# Patient Record
Sex: Female | Born: 1985 | Hispanic: No | Marital: Married | Smoking: Never smoker
Health system: Southern US, Community
[De-identification: ages and names within clinical notes are randomized; demographics above are authoritative.]

## PROBLEM LIST (undated history)

## (undated) DIAGNOSIS — Z789 Other specified health status: Secondary | ICD-10-CM

## (undated) HISTORY — PX: MASTECTOMY: SHX3

---

## 2017-09-03 LAB — OB RESULTS CONSOLE GC/CHLAMYDIA
Chlamydia: NEGATIVE
Gonorrhea: NEGATIVE

## 2017-09-03 LAB — OB RESULTS CONSOLE HIV ANTIBODY (ROUTINE TESTING): HIV: NONREACTIVE

## 2017-09-03 LAB — OB RESULTS CONSOLE HEPATITIS B SURFACE ANTIGEN: Hepatitis B Surface Ag: NEGATIVE

## 2017-09-03 LAB — OB RESULTS CONSOLE RUBELLA ANTIBODY, IGM: Rubella: IMMUNE

## 2017-12-17 ENCOUNTER — Inpatient Hospital Stay (HOSPITAL_COMMUNITY): Admission: AD | Admit: 2017-12-17 | Payer: Self-pay | Source: Ambulatory Visit | Admitting: Obstetrics & Gynecology

## 2017-12-18 NOTE — L&D Delivery Note (Addendum)
Obstetrical Delivery Note   Date of Delivery:   03/08/2018 [facility] OB[facility]   [facility] Gestational Age/EDD: 2620w2d  Reason for Admission: Active labor Antepartum complications: none  Delivered By:   Cornelia Copaharlie Arlen Legendre, Jr MD  Delivery Type:   vacuum, outlet  Delivery Details:   Called to assess patient for assistance with vaginal delivery. Patient pushed decently well to +4 but patient with poor maternal effort past this. I offered her operative vaginal delivery via vacuum assistance, which she agreed to. Foley removed and kiwi vacuum placed at flexion point and easily delivered with next contraction. Newborn not vigorous on delivery so immediately cord clamped and cut and handed to awaiting peds team. Placenta out via active third stage. Rectal done and 3b perineal laceration noted; rectal confirmatory. I supervised Dr. Nira Retortegele and allis clamps used to grasp both retracted ends and 1-0 interrupted vicryl sutures used to re approximate the ends in an end to end fashion, with good support and tone at the end of the procedure. Dr. Nira Retortegele to finish repair by herself, but I was called back into the room after a few minutes due to brisk bleeding. Atony suspected and ?membranes removed and clot. Placenta inspected and no fragments appeared to be missing and tone improved with bimanual massage, more pitocin and IM methergine x 1; also buccal cytotec given. Brisk bleeding still noted and cervix ran and large posterior cervical laceration noted, approx 5-6cm. With great difficulty, this was repaired by me using #2 chromic and 2-0 vicryl in running fashion and locking at times. Hemostasis noted from this area and I supervised Dr. Nira Retortegele finish the remainder of the repair using 2-0 and 1-0 vicryl in the usual fashion.  Rectal exam at the end was negative Anesthesia:    epidural Intrapartum complications: None GBS:    Negative Laceration:    3rd degree (3b) and posterior cervical  laceration Episiotomy:    none Rectal exam:   negative Placenta:    Delivered and expressed via active management. Intact: yes. To pathology: no.  Delayed Cord Clamping: no Estimated Blood Loss:  2000mL  Baby:    Liveborn female, APGARs 7/9, weight 3805gm  CBC obtained and results below. Will trend them q4h and foley catheter to be placed to closely follow UOP. 1L IVF bolus given during procedure.   CBC Latest Ref Rng & Units 03/08/2018 03/08/2018  WBC 4.0 - 10.5 K/uL 24.7(H) 12.2(H)  Hemoglobin 12.0 - 15.0 g/dL 1.6(X9.7(L) 09.614.0  Hematocrit 36.0 - 46.0 % 29.0(L) 40.9  Platelets 150 - 400 K/uL 134(L) 162     Cornelia Copaharlie Aristides Luckey, Jr. MD Attending Center for [facility](Faculty Practice)

## 2018-01-31 LAB — OB RESULTS CONSOLE GBS: GBS: NEGATIVE

## 2018-02-08 ENCOUNTER — Other Ambulatory Visit: Payer: Self-pay | Admitting: Obstetrics & Gynecology

## 2018-02-08 DIAGNOSIS — O4440 Low lying placenta NOS or without hemorrhage, unspecified trimester: Secondary | ICD-10-CM

## 2018-02-12 ENCOUNTER — Encounter (HOSPITAL_COMMUNITY): Payer: Self-pay | Admitting: Radiology

## 2018-02-12 ENCOUNTER — Ambulatory Visit (HOSPITAL_COMMUNITY)
Admission: RE | Admit: 2018-02-12 | Discharge: 2018-02-12 | Disposition: A | Discharge status: Home / Self Care | Payer: Medicaid Other | Source: Ambulatory Visit | Attending: Obstetrics & Gynecology | Admitting: Obstetrics & Gynecology

## 2018-02-12 ENCOUNTER — Other Ambulatory Visit: Payer: Self-pay | Admitting: Obstetrics & Gynecology

## 2018-02-12 DIAGNOSIS — O4440 Low lying placenta NOS or without hemorrhage, unspecified trimester: Secondary | ICD-10-CM

## 2018-02-12 DIAGNOSIS — O4442 Low lying placenta NOS or without hemorrhage, second trimester: Secondary | ICD-10-CM | POA: Diagnosis not present

## 2018-02-12 DIAGNOSIS — Z3A26 26 weeks gestation of pregnancy: Secondary | ICD-10-CM | POA: Diagnosis not present

## 2018-02-12 DIAGNOSIS — Z3A36 36 weeks gestation of pregnancy: Secondary | ICD-10-CM

## 2018-02-12 IMAGING — US US MFM OB TRANSVAGINAL
1 series · 15 of 28 positions shown · non-contrast
Comparison: none

[Series 1: us mfm ob transvaginal · 33 acquisitions, 15 frames shown]
[im 1/33]
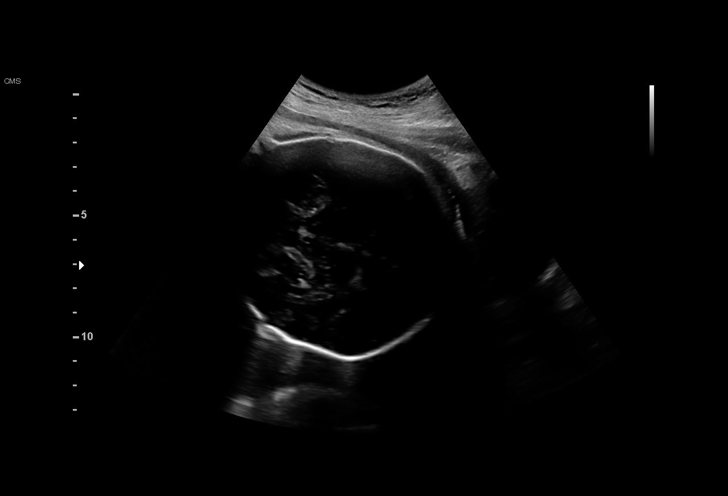
[im 3/33]
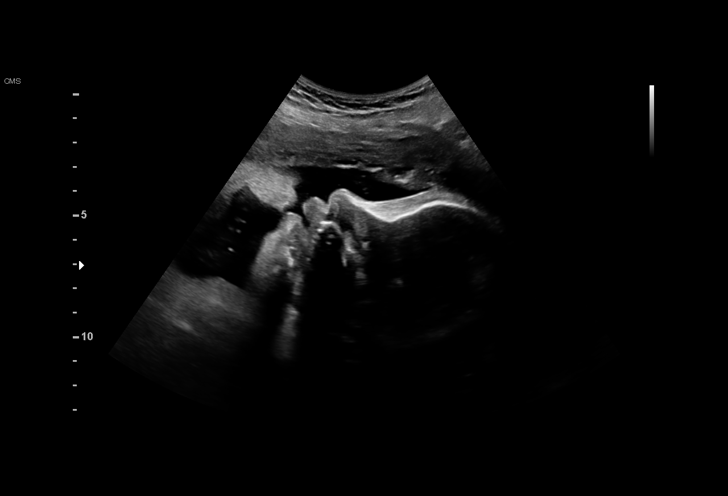
[im 5/33]
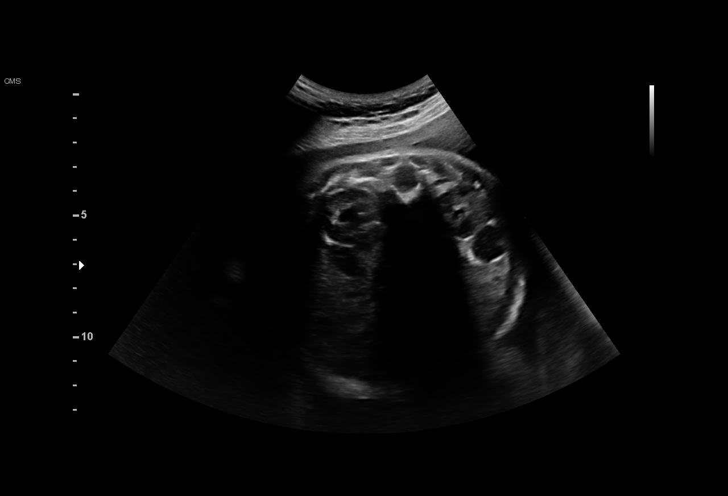
[im 8/33]
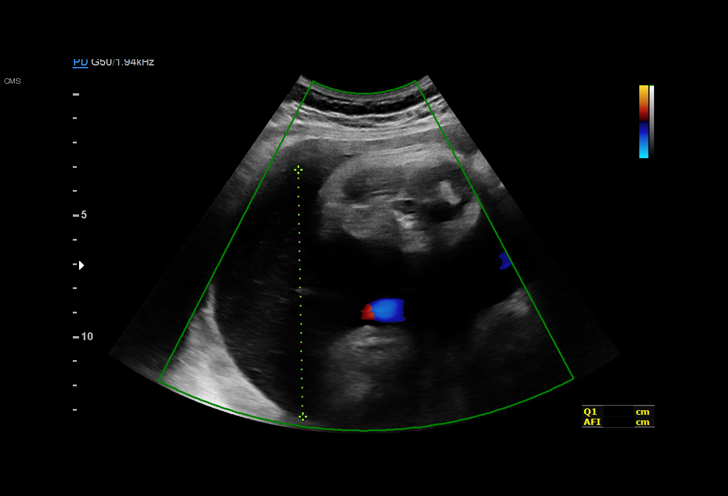
[im 10/33]
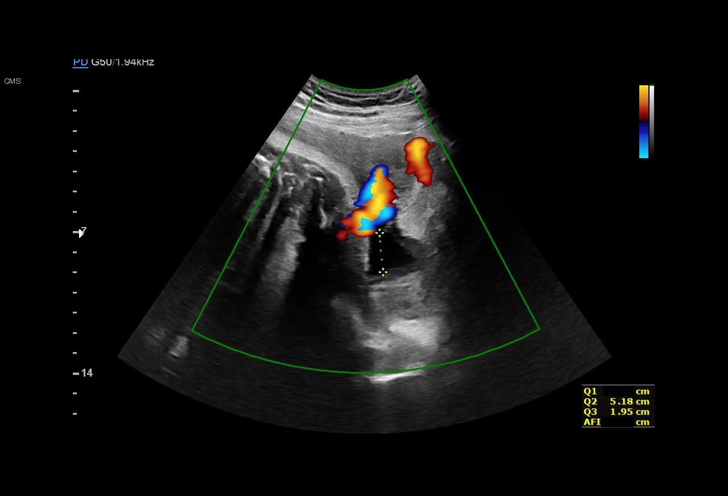
[im 12/33]
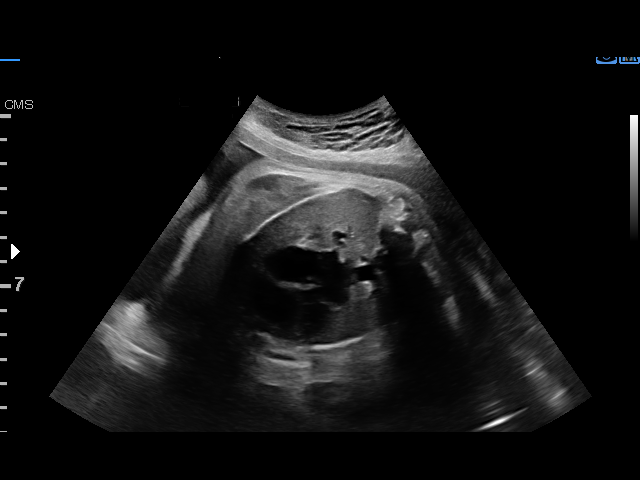
[im 15/33]
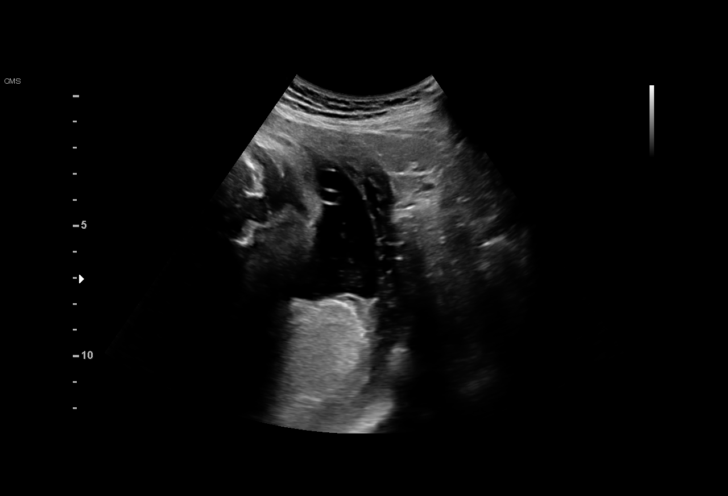
[im 17/33]
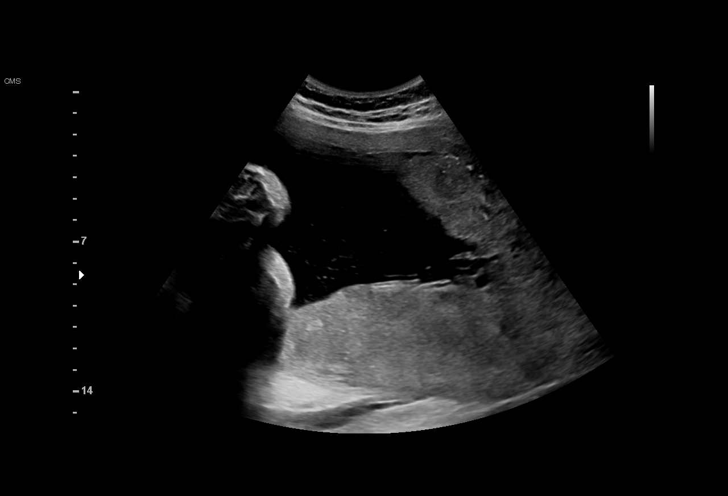
[im 18/33]
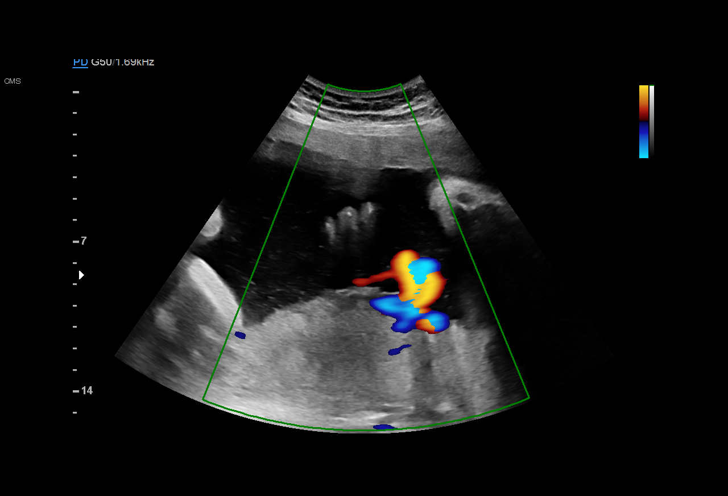
[im 21/33]
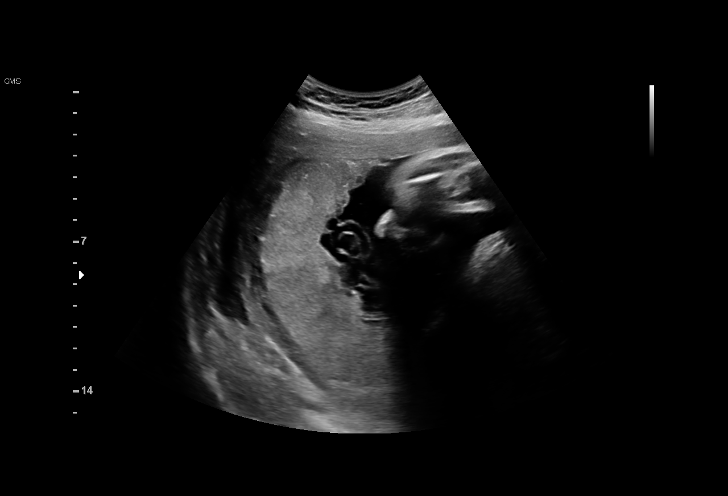
[im 23/33]
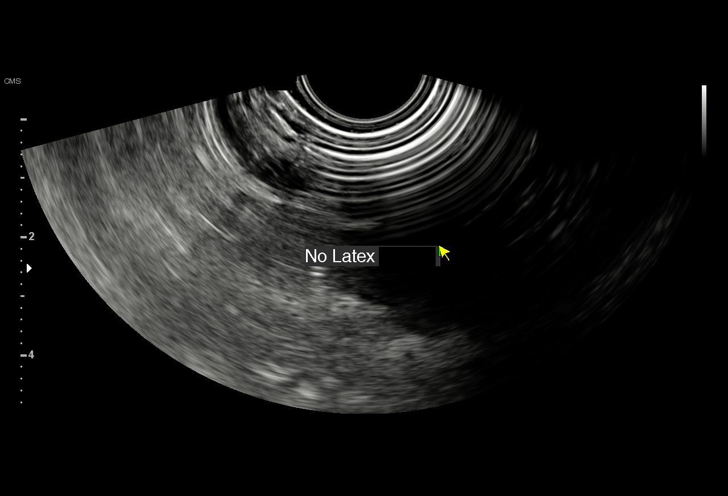
[im 25/33]
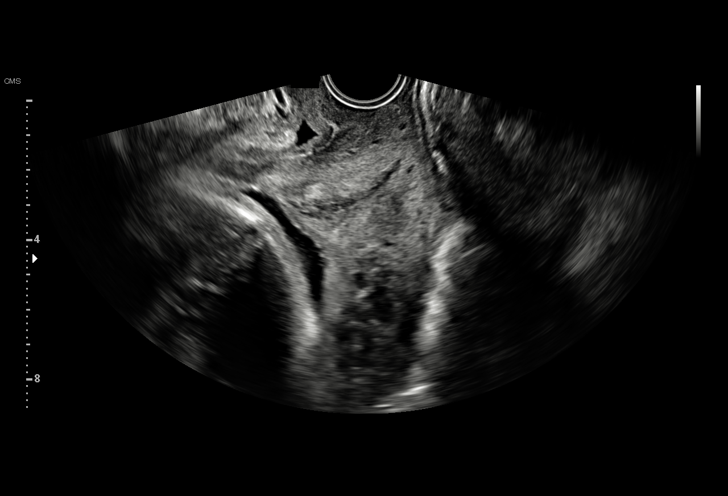
[im 28/33]
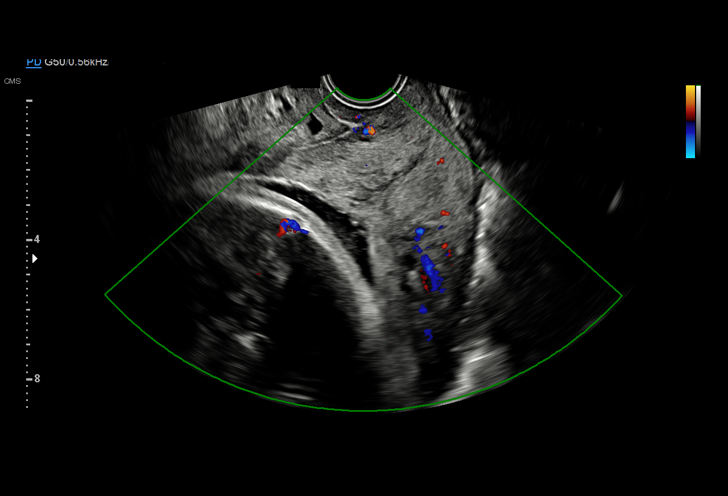
[im 30/33]
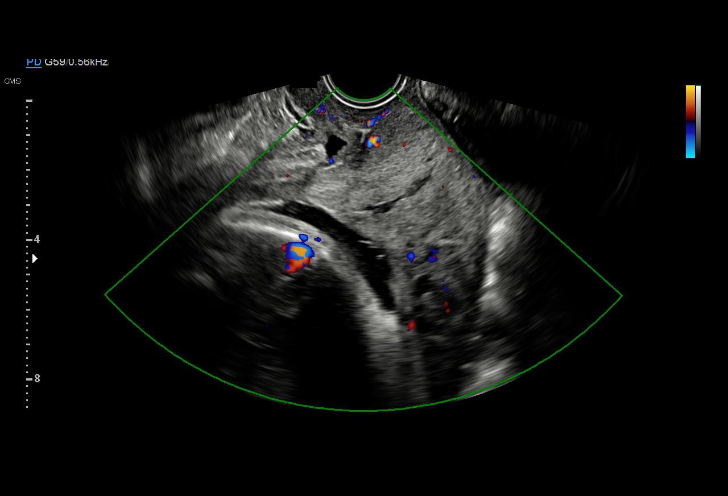
[im 33/33]
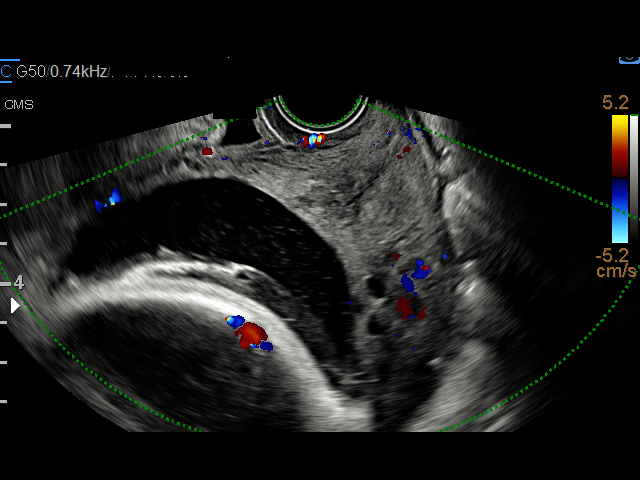

[15 of 28 positions shown; findings below may reference images not displayed]

[REDACTED]-
Faculty Physician

1  IDALISE             [PHONE_NUMBER]      [PHONE_NUMBER]     [PHONE_NUMBER]
2  IDALISE             [PHONE_NUMBER]      [PHONE_NUMBER]     [PHONE_NUMBER]
Indications

36 weeks gestation of pregnancy
Low lying placenta, antepartum                 [H2]
OB History

Gravidity:    1         Term:   0        Prem:   0        SAB:   0
TOP:          0       Ectopic:  0        Living: 0
Fetal Evaluation

Num Of Fetuses:     1
Fetal Heart         144
Rate(bpm):
Cardiac Activity:   Observed
Presentation:       Cephalic
Placenta:           Right lateral, above cervical os
P. Cord Insertion:  Visualized

Amniotic Fluid
AFI FV:      Subjectively within normal limits

AFI Sum(cm)     %Tile       Largest Pocket(cm)
21.[REDACTED]
RUQ(cm)       RLQ(cm)       LUQ(cm)        LLQ(cm)
10.16
Gestational Age

LMP:           36w 6d        Date:  [DATE]                 EDD:   [DATE]
Best:          36w 6d     Det. By:  LMP  ([DATE])          EDD:   [DATE]
Cervix Uterus Adnexa

Cervix
Length:            3.1  cm.
Measured transvaginally.

Uterus
No abnormality visualized.

Left Ovary
Not visualized.

Right Ovary
Not visualized.

Cul De Sac:   No free fluid seen.

Adnexa:       No abnormality visualized.
Impression

SIUP at 36+6 weeks
Cephalic presentation
Normal amniotic fluid volume
EV views of cervix: normal length without funneling; no previa
Right lateral placenta
Recommendations

Follow-up as clinically indicated

## 2018-02-22 ENCOUNTER — Ambulatory Visit (HOSPITAL_COMMUNITY): Payer: Self-pay

## 2018-03-07 ENCOUNTER — Inpatient Hospital Stay (EMERGENCY_DEPARTMENT_HOSPITAL)
Admission: AD | Admit: 2018-03-07 | Discharge: 2018-03-07 | Disposition: A | Discharge status: Home / Self Care | Payer: Medicaid Other | Source: Ambulatory Visit | Attending: Obstetrics and Gynecology | Admitting: Obstetrics and Gynecology

## 2018-03-07 ENCOUNTER — Encounter (HOSPITAL_COMMUNITY): Payer: Self-pay | Admitting: *Deleted

## 2018-03-07 DIAGNOSIS — O471 False labor at or after 37 completed weeks of gestation: Secondary | ICD-10-CM | POA: Diagnosis not present

## 2018-03-07 DIAGNOSIS — Z0371 Encounter for suspected problem with amniotic cavity and membrane ruled out: Secondary | ICD-10-CM

## 2018-03-07 DIAGNOSIS — Z3A40 40 weeks gestation of pregnancy: Secondary | ICD-10-CM

## 2018-03-07 DIAGNOSIS — O479 False labor, unspecified: Secondary | ICD-10-CM

## 2018-03-07 HISTORY — DX: Other specified health status: Z78.9

## 2018-03-07 NOTE — Discharge Instructions (Signed)
Labor Induction Labor induction is when steps are taken to cause a pregnant woman to begin the labor process. Most women go into labor on their own between 37 weeks and 42 weeks of the pregnancy. When this does not happen or when there is a medical need, methods may be used to induce labor. Labor induction causes a pregnant woman's uterus to contract. It also causes the cervix to soften (ripen), open (dilate), and thin out (efface). Usually, labor is not induced before 39 weeks of the pregnancy unless there is a problem with the baby or mother. Before inducing labor, your health care provider will consider a number of factors, including the following:  The medical condition of you and the baby.  How many weeks along you are.  The status of the baby's lung maturity.  The condition of the cervix.  The position of the baby. What are the reasons for labor induction? Labor may be induced for the following reasons:  The health of the baby or mother is at risk.  The pregnancy is overdue by 1 week or more.  The water breaks but labor does not start on its own.  The mother has a health condition or serious illness, such as high blood pressure, infection, placental abruption, or diabetes.  The amniotic fluid amounts are low around the baby.  The baby is distressed. Convenience or wanting the baby to be born on a certain date is not a reason for inducing labor. What methods are used for labor induction? Several methods of labor induction may be used, such as:  Prostaglandin medicine. This medicine causes the cervix to dilate and ripen. The medicine will also start contractions. It can be taken by mouth or by inserting a suppository into the vagina.  Inserting a thin tube (catheter) with a balloon on the end into the vagina to dilate the cervix. Once inserted, the balloon is expanded with water, which causes the cervix to open.  Stripping the membranes. Your health care provider separates  amniotic sac tissue from the cervix, causing the cervix to be stretched and causing the release of a hormone called progesterone. This may cause the uterus to contract. It is often done during an office visit. You will be sent home to wait for the contractions to begin. You will then come in for an induction.  Breaking the water. Your health care provider makes a hole in the amniotic sac using a small instrument. Once the amniotic sac breaks, contractions should begin. This may still take hours to see an effect.  Medicine to trigger or strengthen contractions. This medicine is given through an IV access tube inserted into a vein in your arm. All of the methods of induction, besides stripping the membranes, will be done in the hospital. Induction is done in the hospital so that you and the baby can be carefully monitored. How long does it take for labor to be induced? Some inductions can take up to 2-3 days. Depending on the cervix, it usually takes less time. It takes longer when you are induced early in the pregnancy or if this is your first pregnancy. If a mother is still pregnant and the induction has been going on for 2-3 days, either the mother will be sent home or a cesarean delivery will be needed. What are the risks associated with labor induction? Some of the risks of induction include:  Changes in fetal heart rate, such as too high, too low, or erratic.  Fetal distress.    Chance of infection for the mother and baby.  Increased chance of having a cesarean delivery.  Breaking off (abruption) of the placenta from the uterus (rare).  Uterine rupture (very rare). When induction is needed for medical reasons, the benefits of induction may outweigh the risks. What are some reasons for not inducing labor? Labor induction should not be done if:  It is shown that your baby does not tolerate labor.  You have had previous surgeries on your uterus, such as a myomectomy or the removal of  fibroids.  Your placenta lies very low in the uterus and blocks the opening of the cervix (placenta previa).  Your baby is not in a head-down position.  The umbilical cord drops down into the birth canal in front of the baby. This could cut off the baby's blood and oxygen supply.  You have had a previous cesarean delivery.  There are unusual circumstances, such as the baby being extremely premature. This information is not intended to replace advice given to you by your health care provider. Make sure you discuss any questions you have with your health care provider. Document Released: 04/25/2007 Document Revised: 05/11/2016 Document Reviewed: 07/03/2013 Elsevier Interactive Patient Education  2017 Elsevier Inc.  

## 2018-03-07 NOTE — MAU Provider Note (Signed)
S: Ms. Rico AlaSui Cotrell is a 32 y.o. G1P0 at 5932w1d  who presents to MAU today complaining of leaking of fluid since 0900. She endorses vaginal bleeding. She endorses contractions. She reports normal fetal movement.    O: BP 117/75 (BP Location: Right Arm)   Pulse 68   Temp 97.9 F (36.6 C) (Oral)   Resp 16   Wt 167 lb (75.8 kg)   LMP 05/30/2017 (Exact Date)   SpO2 98%  GENERAL: Well-developed, well-nourished female in no acute distress.  HEAD: Normocephalic, atraumatic.  CHEST: Normal effort of breathing, regular heart rate ABDOMEN: Soft, nontender, gravid PELVIC: Normal external female genitalia. Vagina is pink and rugated. Cervix with normal contour, no lesions. Normal discharge.  Negative pooling. Small amount of dark red mucous and blood at cervical os.   Cervical exam:  Dilation: 1 Effacement (%): Thick Station: Ballotable Exam by:: Ma Hillock. Neill CNM  Fetal Monitoring: Baseline: 120 Variability: moderate Accelerations: 15x15 Decelerations: none Contractions: irregular uc's  Fern negative.   A: SIUP at 5232w1d  Membranes intact  P: Discharge home Postdates IOL scheduled for 3/27 at 0700.   Rolm Bookbindereill, Caroline M, [place] 03/07/2018 2:46 PM

## 2018-03-07 NOTE — MAU Note (Signed)
Contractions started this morning. Little bit of bleeding.  No water leaking.  No recent exam

## 2018-03-08 ENCOUNTER — Inpatient Hospital Stay (HOSPITAL_COMMUNITY): Payer: Medicaid Other | Admitting: Anesthesiology

## 2018-03-08 ENCOUNTER — Encounter (HOSPITAL_COMMUNITY): Payer: Self-pay

## 2018-03-08 ENCOUNTER — Other Ambulatory Visit: Payer: Self-pay

## 2018-03-08 ENCOUNTER — Inpatient Hospital Stay (HOSPITAL_COMMUNITY)
Admission: AD | Admit: 2018-03-08 | Discharge: 2018-03-10 | DRG: 768 | Disposition: A | Discharge status: Home / Self Care | Payer: Medicaid Other | Source: Ambulatory Visit | Attending: Obstetrics and Gynecology | Admitting: Obstetrics and Gynecology

## 2018-03-08 DIAGNOSIS — O9081 Anemia of the puerperium: Secondary | ICD-10-CM | POA: Diagnosis not present

## 2018-03-08 DIAGNOSIS — Z880 Allergy status to penicillin: Secondary | ICD-10-CM | POA: Diagnosis not present

## 2018-03-08 DIAGNOSIS — Z3A40 40 weeks gestation of pregnancy: Secondary | ICD-10-CM

## 2018-03-08 DIAGNOSIS — O471 False labor at or after 37 completed weeks of gestation: Secondary | ICD-10-CM | POA: Diagnosis not present

## 2018-03-08 DIAGNOSIS — Z8759 Personal history of other complications of pregnancy, childbirth and the puerperium: Secondary | ICD-10-CM

## 2018-03-08 DIAGNOSIS — Z3483 Encounter for supervision of other normal pregnancy, third trimester: Secondary | ICD-10-CM | POA: Diagnosis present

## 2018-03-08 DIAGNOSIS — D62 Acute posthemorrhagic anemia: Secondary | ICD-10-CM

## 2018-03-08 LAB — CBC
HEMATOCRIT: 40.9 % (ref 36.0–46.0)
HEMATOCRIT: 29 % — AB (ref 36.0–46.0)
HEMOGLOBIN: 14 g/dL (ref 12.0–15.0)
Hemoglobin: 9.7 g/dL — ABNORMAL LOW (ref 12.0–15.0)
MCH: 30.4 pg (ref 26.0–34.0)
MCH: 29.8 pg (ref 26.0–34.0)
MCHC: 34.2 g/dL (ref 30.0–36.0)
MCHC: 33.4 g/dL (ref 30.0–36.0)
MCV: 88.7 fL (ref 78.0–100.0)
MCV: 89 fL (ref 78.0–100.0)
Platelets: 162 10*3/uL (ref 150–400)
Platelets: 134 10*3/uL — ABNORMAL LOW (ref 150–400)
RBC: 4.61 MIL/uL (ref 3.87–5.11)
RBC: 3.26 MIL/uL — ABNORMAL LOW (ref 3.87–5.11)
RDW: 13.6 % (ref 11.5–15.5)
RDW: 13.6 % (ref 11.5–15.5)
WBC: 12.2 10*3/uL — AB (ref 4.0–10.5)
WBC: 24.7 10*3/uL — AB (ref 4.0–10.5)

## 2018-03-08 LAB — ABO/RH: ABO/RH(D): B POS

## 2018-03-08 MED ORDER — PHENYLEPHRINE 40 MCG/ML (10ML) SYRINGE FOR IV PUSH (FOR BLOOD PRESSURE SUPPORT)
80.0000 ug | PREFILLED_SYRINGE | INTRAVENOUS | Status: DC | PRN
  Filled 2018-03-08: qty 5

## 2018-03-08 MED ORDER — FENTANYL 2.5 MCG/ML BUPIVACAINE 1/10 % EPIDURAL INFUSION (WH - ANES)
INTRAMUSCULAR | Status: AC
  Filled 2018-03-08: qty 100

## 2018-03-08 MED ORDER — MISOPROSTOL 200 MCG PO TABS
400.0000 ug | ORAL_TABLET | Freq: Once | ORAL | Status: AC
  Administered 2018-03-08: 400 ug via BUCCAL

## 2018-03-08 MED ORDER — OXYCODONE-ACETAMINOPHEN 5-325 MG PO TABS: 1.0000 | ORAL_TABLET | ORAL | Status: DC | PRN

## 2018-03-08 MED ORDER — ACETAMINOPHEN 325 MG PO TABS: 650.0000 mg | ORAL_TABLET | ORAL | Status: DC | PRN

## 2018-03-08 MED ORDER — OXYCODONE-ACETAMINOPHEN 5-325 MG PO TABS: 2.0000 | ORAL_TABLET | ORAL | Status: DC | PRN

## 2018-03-08 MED ORDER — DIPHENHYDRAMINE HCL 50 MG/ML IJ SOLN
INTRAMUSCULAR | Status: AC
  Filled 2018-03-08: qty 1

## 2018-03-08 MED ORDER — DIPHENHYDRAMINE HCL 50 MG/ML IJ SOLN: 12.5000 mg | INTRAMUSCULAR | Status: DC | PRN

## 2018-03-08 MED ORDER — LIDOCAINE HCL (PF) 1 % IJ SOLN
30.0000 mL | INTRAMUSCULAR | Status: AC | PRN
  Administered 2018-03-08 (×2): 30 mL via SUBCUTANEOUS
  Filled 2018-03-08 (×3): qty 30

## 2018-03-08 MED ORDER — LACTATED RINGERS IV SOLN: 500.0000 mL | Freq: Once | INTRAVENOUS | Status: DC

## 2018-03-08 MED ORDER — OXYTOCIN BOLUS FROM INFUSION
500.0000 mL | Freq: Once | INTRAVENOUS | Status: AC
  Administered 2018-03-08: 999 mL/h via INTRAVENOUS

## 2018-03-08 MED ORDER — LACTATED RINGERS IV BOLUS (SEPSIS): 500.0000 mL | Freq: Once | INTRAVENOUS | Status: DC

## 2018-03-08 MED ORDER — HYDROMORPHONE HCL 1 MG/ML IJ SOLN
1.0000 mg | Freq: Once | INTRAMUSCULAR | Status: DC
  Filled 2018-03-08: qty 1

## 2018-03-08 MED ORDER — LACTATED RINGERS IV SOLN
500.0000 mL | Freq: Once | INTRAVENOUS | Status: AC
  Administered 2018-03-08: 500 mL via INTRAVENOUS

## 2018-03-08 MED ORDER — PHENYLEPHRINE 40 MCG/ML (10ML) SYRINGE FOR IV PUSH (FOR BLOOD PRESSURE SUPPORT): 80.0000 ug | PREFILLED_SYRINGE | INTRAVENOUS | Status: DC | PRN

## 2018-03-08 MED ORDER — FLEET ENEMA 7-19 GM/118ML RE ENEM: 1.0000 | ENEMA | RECTAL | Status: DC | PRN

## 2018-03-08 MED ORDER — METHYLERGONOVINE MALEATE 0.2 MG/ML IJ SOLN
INTRAMUSCULAR | Status: AC
  Filled 2018-03-08: qty 1

## 2018-03-08 MED ORDER — LIDOCAINE HCL (PF) 1 % IJ SOLN
INTRAMUSCULAR | Status: DC | PRN
  Administered 2018-03-08: 6 mL via EPIDURAL
  Administered 2018-03-08: 30 mL

## 2018-03-08 MED ORDER — TERBUTALINE SULFATE 1 MG/ML IJ SOLN
INTRAMUSCULAR | Status: AC
  Administered 2018-03-08: 0.25 mg
  Filled 2018-03-08: qty 1

## 2018-03-08 MED ORDER — FENTANYL 2.5 MCG/ML BUPIVACAINE 1/10 % EPIDURAL INFUSION (WH - ANES)
14.0000 mL/h | INTRAMUSCULAR | Status: DC | PRN
  Administered 2018-03-08 (×2): 14 mL/h via EPIDURAL

## 2018-03-08 MED ORDER — PHENYLEPHRINE 40 MCG/ML (10ML) SYRINGE FOR IV PUSH (FOR BLOOD PRESSURE SUPPORT)
80.0000 ug | PREFILLED_SYRINGE | INTRAVENOUS | Status: DC | PRN
  Administered 2018-03-08 (×2): 40 ug via INTRAVENOUS
  Filled 2018-03-08: qty 10
  Filled 2018-03-08: qty 5

## 2018-03-08 MED ORDER — OXYTOCIN 40 UNITS IN LACTATED RINGERS INFUSION - SIMPLE MED
1.0000 m[IU]/min | INTRAVENOUS | Status: DC
  Administered 2018-03-08: 2 m[IU]/min via INTRAVENOUS

## 2018-03-08 MED ORDER — ONDANSETRON HCL 4 MG/2ML IJ SOLN: 4.0000 mg | Freq: Four times a day (QID) | INTRAMUSCULAR | Status: DC | PRN

## 2018-03-08 MED ORDER — FENTANYL CITRATE (PF) 100 MCG/2ML IJ SOLN
100.0000 ug | INTRAMUSCULAR | Status: DC | PRN
  Administered 2018-03-08: 100 ug via INTRAVENOUS

## 2018-03-08 MED ORDER — EPHEDRINE 5 MG/ML INJ
10.0000 mg | INTRAVENOUS | Status: DC | PRN
  Filled 2018-03-08: qty 2

## 2018-03-08 MED ORDER — TERBUTALINE SULFATE 1 MG/ML IJ SOLN
0.2500 mg | Freq: Once | INTRAMUSCULAR | Status: DC | PRN
  Filled 2018-03-08: qty 1

## 2018-03-08 MED ORDER — PHENYLEPHRINE 40 MCG/ML (10ML) SYRINGE FOR IV PUSH (FOR BLOOD PRESSURE SUPPORT)
PREFILLED_SYRINGE | INTRAVENOUS | Status: AC
  Filled 2018-03-08: qty 20

## 2018-03-08 MED ORDER — METHYLERGONOVINE MALEATE 0.2 MG/ML IJ SOLN
0.2000 mg | Freq: Once | INTRAMUSCULAR | Status: AC
  Administered 2018-03-08: 0.2 mg via INTRAMUSCULAR

## 2018-03-08 MED ORDER — SOD CITRATE-CITRIC ACID 500-334 MG/5ML PO SOLN: 30.0000 mL | ORAL | Status: DC | PRN

## 2018-03-08 MED ORDER — LACTATED RINGERS IV SOLN
INTRAVENOUS | Status: DC
  Administered 2018-03-08 (×3): via INTRAVENOUS

## 2018-03-08 MED ORDER — LACTATED RINGERS IV SOLN
500.0000 mL | INTRAVENOUS | Status: DC | PRN
  Administered 2018-03-08: 500 mL via INTRAVENOUS
  Administered 2018-03-08: 1000 mL via INTRAVENOUS
  Administered 2018-03-08: 500 mL via INTRAVENOUS

## 2018-03-08 MED ORDER — EPHEDRINE 5 MG/ML INJ: 10.0000 mg | INTRAVENOUS | Status: DC | PRN

## 2018-03-08 MED ORDER — DIPHENHYDRAMINE HCL 50 MG/ML IJ SOLN
50.0000 mg | Freq: Once | INTRAMUSCULAR | Status: AC
  Administered 2018-03-08: 50 mg via INTRAVENOUS

## 2018-03-08 MED ORDER — MISOPROSTOL 200 MCG PO TABS
600.0000 ug | ORAL_TABLET | Freq: Once | ORAL | Status: AC
  Administered 2018-03-08: 600 ug via RECTAL

## 2018-03-08 MED ORDER — OXYTOCIN 40 UNITS IN LACTATED RINGERS INFUSION - SIMPLE MED
2.5000 [IU]/h | INTRAVENOUS | Status: DC
  Filled 2018-03-08: qty 1000

## 2018-03-08 MED ORDER — FENTANYL CITRATE (PF) 100 MCG/2ML IJ SOLN
INTRAMUSCULAR | Status: AC
  Filled 2018-03-08: qty 2

## 2018-03-08 MED ORDER — MISOPROSTOL 200 MCG PO TABS
ORAL_TABLET | ORAL | Status: AC
  Filled 2018-03-08: qty 5

## 2018-03-08 NOTE — Progress Notes (Signed)
Interpretor used to answer triage questions and explain plan of care

## 2018-03-08 NOTE — MAU Note (Signed)
Urine in the lab  

## 2018-03-08 NOTE — Anesthesia Preprocedure Evaluation (Signed)
Anesthesia Evaluation  Patient identified by MRN, date of birth, ID band Patient awake    Reviewed: Allergy & Precautions, NPO status , Patient's Chart, lab work & pertinent test results  Airway Mallampati: II  TM Distance: >3 FB Neck ROM: Full    Dental no notable dental hx.    Pulmonary neg pulmonary ROS,    Pulmonary exam normal breath sounds clear to auscultation       Cardiovascular negative cardio ROS Normal cardiovascular exam Rhythm:Regular Rate:Normal     Neuro/Psych negative neurological ROS     GI/Hepatic negative GI ROS,   Endo/Other    Renal/GU      Musculoskeletal   Abdominal   Peds  Hematology   Anesthesia Other Findings   Reproductive/Obstetrics (+) Pregnancy                             Lab Results  Component Value Date   WBC 12.2 (H) 03/08/2018   HGB 14.0 03/08/2018   HCT 40.9 03/08/2018   MCV 88.7 03/08/2018   PLT 162 03/08/2018    Anesthesia Physical Anesthesia Plan  ASA: II  Anesthesia Plan: Epidural   Post-op Pain Management:    Induction:   PONV Risk Score and Plan:   Airway Management Planned:   Additional Equipment:   Intra-op Plan:   Post-operative Plan:   Informed Consent: I have reviewed the patients History and Physical, chart, labs and discussed the procedure including the risks, benefits and alternatives for the proposed anesthesia with the patient or authorized representative who has indicated his/her understanding and acceptance.     Plan Discussed with:   Anesthesia Plan Comments:         Anesthesia Quick Evaluation

## 2018-03-08 NOTE — MAU Note (Signed)
Pt reports to MAU for labor eval.Pt was in MAU yesterday for labor eval and was sent home. Pt states contractions all night long and reports pain a 7.

## 2018-03-08 NOTE — Anesthesia Procedure Notes (Signed)
Epidural Patient location during procedure: OB Start time: 03/08/2018 5:08 PM End time: 03/08/2018 5:31 PM  Staffing Anesthesiologist: Trevor IhaHouser, Gerard Cantara A, MD Performed: anesthesiologist   Preanesthetic Checklist Completed: patient identified, site marked, surgical consent, pre-op evaluation, timeout performed, IV checked, risks and benefits discussed and monitors and equipment checked  Epidural Patient position: sitting Prep: site prepped and draped and DuraPrep Patient monitoring: continuous pulse ox and blood pressure Approach: midline Location: L2-L3 Injection technique: LOR air  Needle:  Needle type: Tuohy  Needle gauge: 17 G Needle length: 9 cm and 9 Needle insertion depth: 5 cm cm Catheter type: closed end flexible Catheter size: 19 Gauge Catheter at skin depth: 10 cm Test dose: negative  Assessment Events: blood not aspirated, injection not painful, no injection resistance, negative IV test and no paresthesia

## 2018-03-08 NOTE — Progress Notes (Signed)
CNM called to bedside to evaluate patient. Patient received IV fentanyl and broke out in a rash on both arms. Denies any shortness of breath or other symptoms. 50mg  benedryl IV given. Rash continues to redden. Will monitor patient closely.  Rolm BookbinderCaroline M Neill, CNM 03/08/18 1:24 PM

## 2018-03-08 NOTE — H&P (Addendum)
OBSTETRIC ADMISSION HISTORY AND PHYSICAL  Colleen [name] is a 32 y.o. female G1P0 with IUP at [redacted]w[redacted]d by LMP presenting with labor. She reports she has been having contractions (7-10/10 in severity) every 3-5 minutes. She reports small amount of vaginal bleeding, +FMs, No LOF, no blurry vision, headaches or peripheral edema, and RUQ pain.  She plans on breast feeding. She declines birth control  She received her prenatal care at [facility]   iPad Interpreter [phone] used to obtain history, patient and husband speak Burmese.   Dating: By LMP --->  Estimated Date of Delivery: 03/06/18  Sono:    @[redacted]w[redacted]d , normal anatomy, cephalic presentation, 347g, 80% EFW, AFI normal, posterior low lying placenta  @[redacted]w[redacted]d , cephalic presentation, AFI normal, R lateral placenta without previa    Prenatal History/Complications:  Past Medical History: Past Medical History:  Diagnosis Date  . Medical history non-contributory     Past Surgical History: Past Surgical History:  Procedure Laterality Date  . MASTECTOMY      Obstetrical History: OB History    Gravida  1   Para      Term      Preterm      AB      Living        SAB      TAB      Ectopic      Multiple      Live Births              Social History: Social History   Socioeconomic History  . Marital status: Married    Spouse name: Not on file  . Number of children: Not on file  . Years of education: Not on file  . Highest education level: Not on file  Occupational History  . Not on file  Social Needs  . Financial resource strain: Not on file  . Food insecurity:    Worry: Not on file    Inability: Not on file  . Transportation needs:    Medical: Not on file    Non-medical: Not on file  Tobacco Use  . Smoking status: Never Smoker  . Smokeless tobacco: Never Used  Substance and Sexual Activity  . Alcohol use: Never    Frequency: Never  . Drug use: Never  . Sexual activity: Yes  Lifestyle  . Physical activity:    Days  per week: Not on file    Minutes per session: Not on file  . Stress: Not on file  Relationships  . Social connections:    Talks on phone: Not on file    Gets together: Not on file    Attends religious service: Not on file    Active member of club or organization: Not on file    Attends meetings of clubs or organizations: Not on file    Relationship status: Not on file  Other Topics Concern  . Not on file  Social History Narrative  . Not on file    Family History: History reviewed. No pertinent family history.  Allergies: Allergies  Allergen Reactions  . Penicillins Itching    Has patient had a PCN reaction causing immediate rash, facial/tongue/throat swelling, SOB or lightheadedness with hypotension: No Has patient had a PCN reaction causing severe rash involving mucus membranes or skin necrosis: No Has patient had a PCN reaction that required hospitalization: No Has patient had a PCN reaction occurring within the last 10 years: No If all of the above answers are "NO", then may proceed with  Cephalosporin use.     Medications Prior to Admission  Medication Sig Dispense Refill Last Dose  . Prenatal Vit-Fe Fumarate-FA (MULTIVITAMIN-PRENATAL) 27-0.8 MG TABS tablet Take 1 tablet by mouth daily at 12 noon.   03/07/2018 at Unknown time     Review of Systems   All systems reviewed and negative except as stated in HPI  Blood pressure 111/75, pulse 69, resp. rate 16, last menstrual period 05/30/2017, SpO2 100 %. General appearance: alert, cooperative and no distress Lungs: clear to auscultation bilaterally Heart: regular rate and rhythm Abdomen: soft, non-tender; bowel sounds normal Extremities: Homans sign is negative, no sign of DVT Presentation: cephalic Fetal monitoringBaseline: 130 bpm, Variability: Good {> 6 bpm), Accelerations: Reactive and Decelerations: Absent Uterine activityFrequency: Every 1-2 minutes Dilation: 4 Effacement (%): 100 Station: -2 Exam by:: Claudette LawsJaton  Burgess rnc   Prenatal labs: ABO, Rh:   B POS  Antibody:   Negative (09/03/2017) Rubella:   Immune (09/03/2017) RPR:   Negative (12/13/2017) HBsAg:   Negative (09/03/2017) HIV:   Negative (09/03/2017) GBS:   Negative (01/31/2018)  1 hr Glucola on 09/13/2017: 164 3 hour GTT on 12/13/2017: passed  Genetic screening  CF negative, quad negative Anatomy US @[redacted]w[redacted]d : female, normal anatomy   Prenatal Transfer Tool  Maternal Diabetes: No Genetic Screening: Normal Maternal Ultrasounds/Referrals: Abnormal:  Findings:   Other:Placenta previa on initial US, resolved as of US on 02/12/2018 Fetal Ultrasounds or other Referrals:  None Maternal Substance Abuse:  No Significant Maternal Medications:  None Significant Maternal Lab Results: None  Results for orders placed or performed during the hospital encounter of 03/08/18 (from the past 24 hour(s))  CBC   Collection Time: 03/08/18 11:05 AM  Result Value Ref Range   WBC 12.2 (H) 4.0 - 10.5 K/uL   RBC 4.61 3.87 - 5.11 MIL/uL   Hemoglobin 14.0 12.0 - 15.0 g/dL   HCT 16.140.9 09.636.0 - 04.546.0 %   MCV 88.7 78.0 - 100.0 fL   MCH 30.4 26.0 - 34.0 pg   MCHC 34.2 30.0 - 36.0 g/dL   RDW 40.913.6 81.111.5 - 91.415.5 %   Platelets 162 150 - 400 K/uL  Type and screen [facility]   Collection Time: 03/08/18 11:05 AM  Result Value Ref Range   ABO/RH(D) B POS    Antibody Screen NEG    Sample Expiration      03/11/2018 Performed at [facility], [address] [place], [place] [address]     Patient Active Problem List   Diagnosis Date Noted  . Normal labor and delivery 03/08/2018    Assessment/Plan:  Colleen [name] is a 32 y.o. G1P0 at 8214w2d here for spontaneous onset of labor.   #Labor: Expectant management  #Pain: As per patient request  #FWB: Category I  #ID:  GBS negative  #MOF: Breastfeeding #MOC:None   Felicie MornHannah E Smith, Medical Student  03/08/2018, 10:57 AM  I confirm that I have verified the information documented in the medical student's  note and that I have also personally reperformed the physical exam and all medical decision making activities.  Rolm BookbinderCaroline M Korrine Sicard, CNM 03/08/18 1:28 PM

## 2018-03-08 NOTE — MAU Note (Signed)
Notified Cleone Slimaroline Neill CNM   4 cm uncomfortable.

## 2018-03-08 NOTE — Progress Notes (Addendum)
Labor Progress Note Colleen [name] is a 32 y.o. G1P0 at 149w2d presented for SOL S:   Patient called out complaining of SROM and an urge to push. Patient pushing involuntarily pushing.  O:  BP (!) 148/79   Pulse (!) 102   Temp 97.9 F (36.6 C) (Oral)   Resp 20   Ht 5\' 3"  (1.6 m)   Wt 167 lb (75.8 kg)   LMP 05/30/2017 (Exact Date)   SpO2 100%   BMI 29.58 kg/m   Fetal Tracing:  Baseline: 135 Variability: moderate Accels: 10x10 Decels: prolonged  Toco: 2-3   CVE: Dilation: Lip/rim Dilation Complete Date: 03/08/18 Dilation Complete Time: 1428 Effacement (%): 100 Cervical Position: Anterior Station: Plus 2 Presentation: Vertex Exam by:: Colleen [name] CNM   A&P: 32 y.o. G1P0 3049w2d SOL #Labor: Dr. Penne LashLeggett called to bedside for fetal bradycardia. Terbutaline given, O2 applied and patient placed in hands and knees position. FHR recovered to 120s with moderate variability. Will reassess in 1 hour.  #Pain: none, allergy to fentanyl and patient does not want epidural #FWB: Cat 2 #GBS negative  Rolm Bookbinderaroline M Tyrone Pautsch, CNM

## 2018-03-08 NOTE — Progress Notes (Signed)
Labor Progress Note Colleen [name] is a 32 y.o. G1P0 at 4245w2d presented for SOL  S:  Patient still reporting pressure and urge to push  O:  BP (!) 148/79   Pulse (!) 102   Temp 97.9 F (36.6 C) (Oral)   Resp 20   Ht 5\' 3"  (1.6 m)   Wt 167 lb (75.8 kg)   LMP 05/30/2017 (Exact Date)   SpO2 100%   BMI 29.58 kg/m   Fetal Tracing:  Baseline: 120 Variability: moderate Accels: 15x15 Decels: variable  Toco: 3-4  CVE: Dilation: Lip/rim Dilation Complete Date: 03/08/18 Dilation Complete Time: 1428 Effacement (%): 100 Cervical Position: Anterior Station: Plus 2 Presentation: Vertex Exam by:: Ma Hillock. Neill CNM   A&P: 32 y.o. G1P0 2345w2d SOL #Labor: No cervical change. Patient requesting epidural. Will attempt to get epidural and start pitocin for augmentation. #Pain: patient desires epidural #FWB: Cat 1 #GBS negative  Rolm Bookbinderaroline M Neill, CNM 4:59 PM

## 2018-03-09 ENCOUNTER — Encounter (HOSPITAL_COMMUNITY): Payer: Self-pay | Admitting: General Practice

## 2018-03-09 DIAGNOSIS — D62 Acute posthemorrhagic anemia: Secondary | ICD-10-CM

## 2018-03-09 LAB — CBC
HCT: 23.2 % — ABNORMAL LOW (ref 36.0–46.0)
HEMATOCRIT: 24 % — AB (ref 36.0–46.0)
HEMOGLOBIN: 7.7 g/dL — AB (ref 12.0–15.0)
HEMOGLOBIN: 8.3 g/dL — AB (ref 12.0–15.0)
MCH: 29.8 pg (ref 26.0–34.0)
MCH: 30.2 pg (ref 26.0–34.0)
MCHC: 33.2 g/dL (ref 30.0–36.0)
MCHC: 34.6 g/dL (ref 30.0–36.0)
MCV: 89.9 fL (ref 78.0–100.0)
MCV: 87.3 fL (ref 78.0–100.0)
Platelets: 105 10*3/uL — ABNORMAL LOW (ref 150–400)
Platelets: 91 10*3/uL — ABNORMAL LOW (ref 150–400)
RBC: 2.58 MIL/uL — AB (ref 3.87–5.11)
RBC: 2.75 MIL/uL — ABNORMAL LOW (ref 3.87–5.11)
RDW: 13.2 % (ref 11.5–15.5)
RDW: 13.6 % (ref 11.5–15.5)
WBC: 17.2 10*3/uL — ABNORMAL HIGH (ref 4.0–10.5)
WBC: 16.2 10*3/uL — ABNORMAL HIGH (ref 4.0–10.5)

## 2018-03-09 LAB — RPR: RPR Ser Ql: NONREACTIVE

## 2018-03-09 LAB — PREPARE RBC (CROSSMATCH)

## 2018-03-09 MED ORDER — OXYCODONE HCL 5 MG PO TABS
5.0000 mg | ORAL_TABLET | ORAL | Status: DC | PRN
  Filled 2018-03-09: qty 1

## 2018-03-09 MED ORDER — TETANUS-DIPHTH-ACELL PERTUSSIS 5-2.5-18.5 LF-MCG/0.5 IM SUSP: 0.5000 mL | Freq: Once | INTRAMUSCULAR | Status: DC

## 2018-03-09 MED ORDER — OXYCODONE HCL 5 MG PO TABS: 10.0000 mg | ORAL_TABLET | ORAL | Status: DC | PRN

## 2018-03-09 MED ORDER — PRENATAL MULTIVITAMIN CH
1.0000 | ORAL_TABLET | Freq: Every day | ORAL | Status: DC
  Filled 2018-03-09: qty 1

## 2018-03-09 MED ORDER — CEFAZOLIN SODIUM-DEXTROSE 2-4 GM/100ML-% IV SOLN
2.0000 g | Freq: Once | INTRAVENOUS | Status: AC
  Administered 2018-03-09: 2 g via INTRAVENOUS
  Filled 2018-03-09: qty 100

## 2018-03-09 MED ORDER — POLYETHYLENE GLYCOL 3350 17 G PO PACK
17.0000 g | PACK | Freq: Every day | ORAL | Status: DC
  Filled 2018-03-09 (×3): qty 1

## 2018-03-09 MED ORDER — OXYCODONE-ACETAMINOPHEN 5-325 MG PO TABS: 2.0000 | ORAL_TABLET | ORAL | Status: DC | PRN

## 2018-03-09 MED ORDER — ONDANSETRON HCL 4 MG PO TABS: 4.0000 mg | ORAL_TABLET | ORAL | Status: DC | PRN

## 2018-03-09 MED ORDER — WITCH HAZEL-GLYCERIN EX PADS: 1.0000 "application " | MEDICATED_PAD | CUTANEOUS | Status: DC | PRN

## 2018-03-09 MED ORDER — ONDANSETRON HCL 4 MG/2ML IJ SOLN: 4.0000 mg | INTRAMUSCULAR | Status: DC | PRN

## 2018-03-09 MED ORDER — OXYCODONE-ACETAMINOPHEN 5-325 MG PO TABS: 1.0000 | ORAL_TABLET | ORAL | Status: DC | PRN

## 2018-03-09 MED ORDER — SIMETHICONE 80 MG PO CHEW: 80.0000 mg | CHEWABLE_TABLET | ORAL | Status: DC | PRN

## 2018-03-09 MED ORDER — IBUPROFEN 600 MG PO TABS
600.0000 mg | ORAL_TABLET | Freq: Four times a day (QID) | ORAL | Status: DC
  Administered 2018-03-09: 600 mg via ORAL
  Filled 2018-03-09 (×2): qty 1

## 2018-03-09 MED ORDER — ACETAMINOPHEN 325 MG PO TABS
650.0000 mg | ORAL_TABLET | ORAL | Status: DC | PRN
Start: 2018-03-09 — End: 2018-03-10
  Administered 2018-03-09 – 2018-03-10 (×7): 650 mg via ORAL
  Filled 2018-03-09 (×8): qty 2

## 2018-03-09 MED ORDER — DIPHENHYDRAMINE HCL 25 MG PO CAPS: 25.0000 mg | ORAL_CAPSULE | Freq: Four times a day (QID) | ORAL | Status: DC | PRN

## 2018-03-09 MED ORDER — LIDOCAINE HCL (PF) 1 % IJ SOLN
INTRAMUSCULAR | Status: AC
  Administered 2018-03-09: 30 mL
  Filled 2018-03-09: qty 30

## 2018-03-09 MED ORDER — ZOLPIDEM TARTRATE 5 MG PO TABS
5.0000 mg | ORAL_TABLET | Freq: Every evening | ORAL | Status: DC | PRN
Start: 2018-03-09 — End: 2018-03-10

## 2018-03-09 MED ORDER — DIBUCAINE 1 % RE OINT: 1.0000 "application " | TOPICAL_OINTMENT | RECTAL | Status: DC | PRN

## 2018-03-09 MED ORDER — COCONUT OIL OIL: 1.0000 "application " | TOPICAL_OIL | Status: DC | PRN

## 2018-03-09 MED ORDER — BENZOCAINE-MENTHOL 20-0.5 % EX AERO
1.0000 "application " | INHALATION_SPRAY | CUTANEOUS | Status: DC | PRN
  Filled 2018-03-09: qty 56

## 2018-03-09 MED ORDER — SODIUM CHLORIDE 0.9 % IV SOLN
Freq: Once | INTRAVENOUS | Status: AC
  Administered 2018-03-09: 03:00:00 via INTRAVENOUS

## 2018-03-09 MED ORDER — SENNOSIDES-DOCUSATE SODIUM 8.6-50 MG PO TABS: 2.0000 | ORAL_TABLET | Freq: Every evening | ORAL | Status: DC | PRN

## 2018-03-09 NOTE — Progress Notes (Signed)
[facility]/ [facility] [phone]. Patient feels good and w/o complaints or questions at this time.

## 2018-03-09 NOTE — Plan of Care (Signed)
[facility].  Ambulating and pain management discussed, no questions or concerns at this time.

## 2018-03-09 NOTE — Anesthesia Postprocedure Evaluation (Signed)
Anesthesia Post Note  Patient: Colleen [name]  Procedure(s) Performed: AN AD HOC LABOR EPIDURAL     Patient location during evaluation: Mother Baby Anesthesia Type: Epidural Level of consciousness: awake and alert Pain management: pain level controlled Vital Signs Assessment: post-procedure vital signs reviewed and stable Respiratory status: spontaneous breathing Cardiovascular status: stable Postop Assessment: epidural receding, patient able to bend at knees and no apparent nausea or vomiting Anesthetic complications: no Comments: Pain score 3.      Last Vitals:  Vitals:   03/09/18 0600 03/09/18 0800  BP: (!) 96/57 (!) 93/52  Pulse: 81 76  Resp: 18 17  Temp: 37.1 C 36.8 C  SpO2: 96%     Last Pain:  Vitals:   03/09/18 0814  TempSrc:   PainSc: 3    Pain Goal: Patients Stated Pain Goal: 3 (03/08/18 2257)               Merrilyn PumaWRINKLE,Triston Skare

## 2018-03-09 NOTE — Progress Notes (Addendum)
POSTPARTUM PROGRESS NOTE  Post Partum Day 1  Subjective: Colleen [name] is a 32 y.o. G1P1001 s/p VAVD at 4232w2d last night. Delivery complicated by 3rd degree perineal and cervical lacerations which required extensive repair; total EBL 2100 ml, which most being from lacerations.   Patient feeling lightheaded and headache overnight. Given blood loss and symptomatic hypotension, patient consented for blood transfusion.   Objective: Blood pressure (!) 96/44, pulse (!) 127, temperature 100.1 F (37.8 C), temperature source Oral, resp. rate 18, height 5\' 3"  (1.6 m), weight 167 lb (75.8 kg), last menstrual period 05/30/2017, SpO2 100 %, unknown if currently breastfeeding.  Physical Exam:  General: awake and alert, although appears somewhat lethargic Chest: no respiratory distress, normal WOB Heart:regular rate, distal pulses 2+ Abdomen: soft, nontender Uterine Fundus: firm, appropriately tender DVT Evaluation: No calf swelling or tenderness Extremities: no edema Skin: warm, dry  Recent Labs    03/08/18 2254 03/09/18 0219  HGB 9.7* 7.7*  HCT 29.0* 23.2*    Assessment/Plan: Colleen [name] is a 32 y.o. G1P1001 s/p VAVD at 2932w2d with delivery complicated by 3rd degree perineal and cervical lacerations which required extensive repair; total EBL 2100 ml.  PPD#1  Contraception: none  Feeding: breast  PPH/Acute blood loss anemia  S/p IVF resuscitation   Transfuse 2U PRBCs  Recheck CBC post-transfusion  Continue to monitor VS closely  Monitor UOP (foley in place)  3rd degree perineal laceration  Oxycodone prn   Bowel regimen  Dispo: plan for discharge home tomorrow if hemodynamically stable.   LOS: 1 day   Kandra NicolasJulie P DegeleMD 03/09/2018, 4:13 AM

## 2018-03-09 NOTE — Lactation Note (Signed)
This note was copied from a baby's chart. Lactation Consultation Note Mom speaks Burmese. Mom speaks some English as well. FOB is interpreter for mom what she doesn't understand. Encouraged parents anytimes they have questions and need interpreter call. Baby 7 hrs old. Mom still in L&D. Mom had PPH  2100ml. Mom is currently getting blood transfusion.  Mom has large round breast. Flat nipples, not very compressible. Pre-pump w/hand pump to evert nipple.  Breast massage, hand expressed 0.5ml in spoon gave to baby. W22/glvoed finger stimulated baby to suck. Baby has high narrow palate. Baby does extended tongue under finger w/weak suck. In football position place baby to breast. Only suckled a few times several burst then holds in mouth.  Fitted mom #16 NS. FOB demonstrated application several times. Mom watching, mom holding baby. Baby held NS in mouth, suckled a few times. Not interested in BF at this time.  Mom sitting upright, became dizzy, laid mom back. Baby sleeping. Placed baby STS between breast. Baby and mom resting. FOB at bedside for safety d/t mom sleepy.  When mom is transfer to RM. Mom will need DEBP and shells. Mom sleepy, encouraged to rest at this time. Will be f/u w/LC/    Patient Name: Colleen [name] Reason for consult: Initial assessment;Term;Other (Comment)(PPH)   Maternal Data Has patient been taught Hand Expression?: Yes Does the patient have breastfeeding experience prior to this delivery?: No  Feeding Feeding Type: Breast Fed Length of feed: 0 min  LATCH Score Latch: Too sleepy or reluctant, no latch achieved, no sucking elicited.  Audible Swallowing: None  Type of Nipple: Flat  Comfort (Breast/Nipple): Soft / non-tender  Hold (Positioning): Full assist, staff holds infant at breast  LATCH Score: 3  Interventions Interventions: Breast feeding basics reviewed;Support pillows;Assisted with latch;Position options;Skin to skin;Expressed  milk;Breast massage;Hand express;Pre-pump if needed;Hand pump;Reverse pressure;Adjust position;Breast compression  Lactation Tools Discussed/Used Tools: Pump;Nipple Shields Nipple shield size: 16 Breast pump type: Manual WIC Program: No Pump Review: Setup, frequency, and cleaning;Milk Storage Initiated by:: L> Cruz Bong RN IBCLC Date initiated:: 03/09/18   Consult Status Consult Status: Follow-up Date: 03/09/18 Follow-up type: In-patient    Charyl DancerCARVER, [name], 4:26 AM

## 2018-03-10 ENCOUNTER — Ambulatory Visit: Payer: Self-pay

## 2018-03-10 LAB — TYPE AND SCREEN
ABO/RH(D): B POS
Antibody Screen: NEGATIVE
UNIT DIVISION: 0
Unit division: 0

## 2018-03-10 LAB — CBC
HCT: 25.1 % — ABNORMAL LOW (ref 36.0–46.0)
Hemoglobin: 8.6 g/dL — ABNORMAL LOW (ref 12.0–15.0)
MCH: 30 pg (ref 26.0–34.0)
MCHC: 34.3 g/dL (ref 30.0–36.0)
MCV: 87.5 fL (ref 78.0–100.0)
Platelets: 101 10*3/uL — ABNORMAL LOW (ref 150–400)
RBC: 2.87 MIL/uL — ABNORMAL LOW (ref 3.87–5.11)
RDW: 14.6 % (ref 11.5–15.5)
WBC: 14.8 10*3/uL — AB (ref 4.0–10.5)

## 2018-03-10 LAB — BPAM RBC
Blood Product Expiration Date: 201904092359
Blood Product Expiration Date: 201904092359
ISSUE DATE / TIME: 201903230303
ISSUE DATE / TIME: 201903230517
Unit Type and Rh: 5100
Unit Type and Rh: 5100

## 2018-03-10 MED ORDER — OXYCODONE HCL 5 MG PO TABS: 5.0000 mg | ORAL_TABLET | ORAL | 0 refills | Status: AC | PRN

## 2018-03-10 MED ORDER — BENZOCAINE-MENTHOL 20-0.5 % EX AERO: 1.0000 "application " | INHALATION_SPRAY | CUTANEOUS | 3 refills | Status: AC | PRN

## 2018-03-10 MED ORDER — TETANUS-DIPHTH-ACELL PERTUSSIS 5-2.5-18.5 LF-MCG/0.5 IM SUSP: 0.5000 mL | Freq: Once | INTRAMUSCULAR | 0 refills | Status: AC

## 2018-03-10 MED ORDER — PRENATAL MULTIVITAMIN CH: 1.0000 | ORAL_TABLET | Freq: Every day | ORAL | 11 refills | Status: AC

## 2018-03-10 MED ORDER — SENNOSIDES-DOCUSATE SODIUM 8.6-50 MG PO TABS: 2.0000 | ORAL_TABLET | Freq: Every evening | ORAL | 3 refills | Status: AC | PRN

## 2018-03-10 MED ORDER — DIBUCAINE 1 % RE OINT: 1.0000 "application " | TOPICAL_OINTMENT | RECTAL | 2 refills | Status: AC | PRN

## 2018-03-10 NOTE — Discharge Summary (Signed)
OB Discharge Summary     Patient Name: Colleen [name] DOB: 1986/08/14 MRN: [id number]  Date of admission: 03/08/2018 Delivering MD: [place] Bing   Date of discharge: 03/10/2018  Admitting diagnosis: LABOR Intrauterine pregnancy: [redacted]w[redacted]d     Secondary diagnosis:  Principal Problem:   Status post vacuum-assisted vaginal delivery Active Problems:   PPH (postpartum hemorrhage)   Third degree perineal laceration during delivery, iiib   Acute blood loss anemia  Additional problems:      Discharge diagnosis: Term Pregnancy Delivered via vacuum-assisted      3rd degree laceration                                                                                  Post partum procedures:blood transfusion  Augmentation: Pitocin  Complications: Hemorrhage>1016mL due to 5-6cm cervical laceration  Hospital course:  Onset of Labor With Vaginal Delivery     32 y.o. yo G1P1001 at [redacted]w[redacted]d was admitted in Latent Labor on 03/08/2018. Patient had a labor course significant for:  Membrane Rupture Time/Date: 2:03 PM ,03/08/2018   Intrapartum Procedures: Episiotomy:   None                                        Lacerations:  3rd degree [4] , 5-6cm cervical laceration.  Patient had a delivery of a Viable infant. 03/08/2018  Information for the patient's newborn:  Marilou, [place] [[id number]]  Delivery Method: Vag-Vacuum Received Pitocin, methergine, cytotec, 2u pRBC transfusion for PPH ~205ml.  Pateint had a postpartum course notable for symptomatic blood loss anemia from PPH, received 2u pRBC with Hb improved 7.7>8.3.  She is ambulating, tolerating a regular diet, passing flatus, and urinating well. Patient is discharged home in stable condition on 03/10/18.  Physical exam  Vitals:   03/09/18 0800 03/09/18 0900 03/09/18 1700 03/10/18 0530  BP: (!) 93/52 (!) 86/54 (!) 87/64 107/71  Pulse: 76 (!) 102 (!) 121 93  Resp: 17 18 18 18   Temp: 98.2 F (36.8 C) 97.7 F (36.5 C) 98.6 F (37 C) 97.8 F (36.6 C)   TempSrc: Oral Oral Oral Oral  SpO2:    100%  Weight:      Height:       General: alert, cooperative and no distress Lochia: appropriate Uterine Fundus: firm Incision: Healing well with no significant drainage DVT Evaluation: No evidence of DVT seen on physical exam. Labs: Lab Results  Component Value Date   WBC 14.8 (H) 03/10/2018   HGB 8.6 (L) 03/10/2018   HCT 25.1 (L) 03/10/2018   MCV 87.5 03/10/2018   PLT 101 (L) 03/10/2018   No flowsheet data found.  Discharge instruction: per After Visit Summary and "Baby and Me Booklet".  After visit meds:  Allergies as of 03/10/2018      Reactions   Penicillins Itching   Has patient had a PCN reaction causing immediate rash, facial/tongue/throat swelling, SOB or lightheadedness with hypotension: No Has patient had a PCN reaction causing severe rash involving mucus membranes or skin necrosis: No Has patient had a PCN reaction that required hospitalization: No  Has patient had a PCN reaction occurring within the last 10 years: No If all of the above answers are "NO", then may proceed with Cephalosporin use.   Fentanyl Rash      Medication List    TAKE these medications   benzocaine-Menthol 20-0.5 % Aero Commonly known as:  DERMOPLAST Apply 1 application topically as needed for irritation (perineal discomfort).   dibucaine 1 % Oint Commonly known as:  NUPERCAINAL Place 1 application rectally as needed for hemorrhoids.   oxyCODONE 5 MG immediate release tablet Commonly known as:  Oxy IR/ROXICODONE Take 1 tablet (5 mg total) by mouth every 4 (four) hours as needed (Pain score 4-6 out of 10).   prenatal multivitamin Tabs tablet Take 1 tablet by mouth daily at 12 noon. What changed:  medication strength   senna-docusate 8.6-50 MG tablet Commonly known as:  Senokot-S Take 2 tablets by mouth at bedtime as needed for mild constipation.   Tdap 5-2.5-18.5 LF-MCG/0.5 injection Commonly known as:  BOOSTRIX Inject 0.5 mLs into the  muscle once for 1 dose.       Diet: routine diet  Activity: Advance as tolerated. Pelvic rest for 6 weeks.   Outpatient follow up:4 weeks Follow up Appt:No future appointments. Follow up Visit:No follow-ups on file.  Postpartum contraception: None  Newborn Data: Live born female  Birth Weight: 8 lb 6.2 oz (3805 g) APGAR: 7, 9  Newborn Delivery   Birth date/time:  03/08/2018 21:14:00 Delivery type:  Vaginal, Vacuum (Extractor)     Baby Feeding: Breast Disposition:home with mother   03/10/2018 Colleen [name], CNM

## 2018-03-10 NOTE — Lactation Note (Signed)
This note was copied from a baby's chart. Lactation Consultation Note  Patient Name: Colleen [name] ZOXWR'UToday's Date: 03/10/2018    Jaci LazierPacifica Interpreter used for [employer]'sBurmese [phone]#[id number]. Baby 40 hours old. RN request LC assist w/ breastfeeding. Baby sleeping.  Mother states she does not have enough breastmilk, only drops so she is giving formula. Provided education how milk comes to volume. Family states baby recently breastfed at 1200 and mother pumped w/ DEBP. LC will need to come back at a later time to assist mother.     Maternal Data    Feeding    LATCH Score                   Interventions    Lactation Tools Discussed/Used     Consult Status      Hardie PulleyBerkelhammer, Keora Eccleston Boschen 03/10/2018, 1:49 PM

## 2018-03-10 NOTE — Lactation Note (Signed)
This note was copied from a baby's chart. Lactation Consultation Note FOB states he will interpret for mom what she doesn't understand. Baby 32 hrs old. Baby had good output, mom stated not feeding well. Mom c/o painful latches. Mom wasn't using NS, bending over to feed baby. Encouraged to bring baby to her. Discussed support and comfort during BF. Mom had been fitted w/#16 NS. Mom stated FOB accidentally took them home. W/edema in breast baby is unable to obtain a deep latch. Applied #16 Ns, chin tug demonstrated. Took # 20 NS for the Rt. Nipple d/t increased edema. When latched, baby popped off and on the breast, finally suckling. After 15 min. Feeding, noted a small amount of spit, and wetness to nipple. Noted slight softening of breast after feeding.  Encouraged to breast massage at intervals and assess for transfer.  Mom has large breast, flat nipples, edematous areolas. Breast are heavy. Shells given, encouraged to wear between feeding.  Mom had 2100 ml blood loss. Mom shown how to use DEBP & how to disassemble, clean, & reassemble parts. Mom knows to pump q3h for 15-20 min. Mom pumped after BF w/no collection of colostrum. Encouraged hand expression after pumping. Reverse pressure and nipple stimulation noted to be helpful.   Baby jaundice in appearance. Baby given formula for supplement to increase feedings. Reviewed supplementing amounts and putting to breast first. Hand expression w/thick drops of colostrum. Milk storage discussed.    Patient Name: Colleen [name]: 03/10/2018 Reason for consult: Follow-up assessment;Infant weight loss   Maternal Data    Feeding Feeding Type: Breast Fed Length of feed: 15 min  LATCH Score Latch: Repeated attempts needed to sustain latch, nipple held in mouth throughout feeding, stimulation needed to elicit sucking reflex.  Audible Swallowing: None  Type of Nipple: Flat  Comfort (Breast/Nipple): Filling, red/small blisters or  bruises, mild/mod discomfort(heavy breast)  Hold (Positioning): Full assist, staff holds infant at breast  LATCH Score: 3  Interventions Interventions: Breast feeding basics reviewed;Support pillows;Assisted with latch;Position options;Skin to skin;Breast massage;Hand express;Shells;Pre-pump if needed;Reverse pressure;Hand pump;Breast compression;DEBP;Adjust position  Lactation Tools Discussed/Used Tools: Shells;Pump;Nipple Shields Nipple shield size: 16 Shell Type: Inverted Breast pump type: Double-Electric Breast Pump Pump Review: Setup, frequency, and cleaning;Milk Storage Initiated by:: Peri JeffersonL. Paz Fuentes RN IBCLC [name] initiated:: 03/10/18   Consult Status Consult Status: Follow-up [name]: 03/11/18 Follow-up type: In-patient    Charyl DancerCARVER, Tadan Shill G 03/10/2018, 7:12 AM

## 2018-03-10 NOTE — Lactation Note (Signed)
This note was copied from a baby's chart. Lactation Consultation Note  Patient Name: Colleen Rico AlaSui Priestly RUEAV'WToday's Date: 03/10/2018 Reason for consult: Follow-up assessment;Other (Comment)(PPH)  FOB helped to interpret. Visited with P1 Mom and FOB, baby 5846 hrs old.  Mom has been exclusively breastfeeding, with occasional bottle of formula.  Mom hasn't pumped since earlier today.  Talked about importance of regular pumping due to PPH, and need for extra stimulation for milk supply. Mom trying to latch baby in cradle hold without any pillow support.  Offered to assist.    Set Mom up with pillows for football hold on left breast.  Breast feels full, and hand expression revealed easy flow of transitional milk.  Baby latched with assistance with Mom sandwiching breast.  Baby repeatedly slipping onto nipple.  Mom denies any pain.  Initiated a 20 mm nipple shield, showing Mom how to properly place it.  Nipple pulled well into shield.  Baby latched on deeply.  Mom taught to use alternate breast compression during feeding.  Numerous swallows identified.  Mom seemed very happy.  After baby fed for 20 mins, demonstrated how to burp easily sitting baby up, and then assisted with positioning baby on right breast in football hold.  Mom placed nipple shield herself, and baby latched and sucked a few times before falling asleep.  FOB put baby STS on his chest.    Assisted with Mom double pumping on initiation setting.  Colostrum expressing.    Encouraged Mom to feed baby on cue whenever he shows he is hungry, and to double pump after.  To feed baby EBM after next breastfeeding.    Reviewed cleaning of pump parts and nipple shield.   Parents are happy with plan. To ask for help as needed.  Feeding Feeding Type: Breast Fed Length of feed: 20 min  LATCH Score Latch: Grasps breast easily, tongue down, lips flanged, rhythmical sucking.  Audible Swallowing: Spontaneous and intermittent  Type of Nipple: Everted at  rest and after stimulation  Comfort (Breast/Nipple): Filling, red/small blisters or bruises, mild/mod discomfort  Hold (Positioning): Assistance needed to correctly position infant at breast and maintain latch.  LATCH Score: 8  Interventions Interventions: Breast feeding basics reviewed;Assisted with latch;Skin to skin;Breast massage;Hand express;Position options;Support pillows;Adjust position;Breast compression;DEBP  Lactation Tools Discussed/Used Tools: Pump Nipple shield size: 20 Breast pump type: Double-Electric Breast Pump   Consult Status Consult Status: Follow-up Date: 03/11/18 Follow-up type: In-patient    Judee ClaraSmith, Bridgitt Raggio E 03/10/2018, 7:32 PM

## 2018-03-11 ENCOUNTER — Ambulatory Visit: Payer: Self-pay

## 2018-03-11 NOTE — Lactation Note (Signed)
This note was copied from a baby's chart. Lactation Consultation Note  Patient Name: Colleen [name]: 03/11/2018 Reason for consult: Follow-up assessment;Infant weight loss   Burmese video interpreter used [id number]. P1, Baby 60 hours old. 9.7% weight loss. Mother had PPH. Oral assessment indicated short anterior lingual frenulum contributing to limited tongue protrusion.  Upper labial frenulum blanches when flanged. Suggest parents discuss with Pediatrician. Observed feeding without NS and baby latches deep and then slips down on nipple repeatedly.   Placed pillows under baby and behind mother. Next feeding advised to use NS. LC assisted mother w/ pumping with DEBP and mother received 10 ml. Encouraged mother to pump after feedings to establish her milk supply. Provided family with WIC pump loaner paperwork and [facility]. Plan: Mother will breastfeed baby on demand at least q 2.5 hours. Mother will post pump and give volume pumped back to baby with the difference with formula.         Maternal Data    Feeding Feeding Type: Breast Fed Length of feed: 15 min  LATCH Score Latch: Repeated attempts needed to sustain latch, nipple held in mouth throughout feeding, stimulation needed to elicit sucking reflex.  Audible Swallowing: A few with stimulation  Type of Nipple: Everted at rest and after stimulation  Comfort (Breast/Nipple): Soft / non-tender  Hold (Positioning): Assistance needed to correctly position infant at breast and maintain latch.  LATCH Score: 7  Interventions Interventions: Breast feeding basics reviewed;Assisted with latch;Breast compression;DEBP  Lactation Tools Discussed/Used     Consult Status Consult Status: Follow-up [name]: 03/12/18 Follow-up type: In-patient    [name], Colleen [name] 03/11/2018, 10:10 AM

## 2018-03-11 NOTE — Lactation Note (Signed)
This note was copied from a baby's chart. Lactation Consultation Note  Patient Name: Colleen [name]'s Date: 03/11/2018 Reason for consult: Follow-up assessment   Burmese video interpreter used. Baby 65 hours old post frenectomy today.  Mother PPH. Oral assessment indicated increased tongue elevation. Reviewed suck training exercises with parents. Baby latched in cross cradle position with and without nipple shield. Baby seems to sustain latch better with #20NS and post frenectomy. Baby latched for more than 30 min. Noted colostrum in nipple shield after feeding. Suggest until mother mother is able to pump 20 mlx 3 to continue to supplement with breastmilk with the difference with formula. Reviewed volume guidelines with parents. Mom encouraged to feed baby 8-12 times/24 hours and with feeding cues.  Reviewed engorgement care and monitoring voids/stools. [facility]    Maternal Data Has patient been taught Hand Expression?: Yes  Feeding Feeding Type: Breast Fed Length of feed: 35 min  LATCH Score Latch: Grasps breast easily, tongue down, lips flanged, rhythmical sucking.  Audible Swallowing: A few with stimulation  Type of Nipple: Everted at rest and after stimulation  Comfort (Breast/Nipple): Filling, red/small blisters or bruises, mild/mod discomfort  Hold (Positioning): Assistance needed to correctly position infant at breast and maintain latch.  LATCH Score: 7  Interventions Interventions: Breast feeding basics reviewed;Assisted with latch;Breast compression;Support pillows;DEBP;Hand pump  Lactation Tools Discussed/Used Tools: Pump;Nipple Shields Nipple shield size: 20 Breast pump type: Double-Electric Breast Pump   Consult Status Consult Status: Complete    Hardie PulleyBerkelhammer, Brielynn Sekula Boschen 03/11/2018, 2:59 PM

## 2018-03-13 ENCOUNTER — Inpatient Hospital Stay (HOSPITAL_COMMUNITY)
Admit: 2018-03-13 | Discharge: 2018-03-13 | Disposition: A | Discharge status: Home / Self Care | Payer: Medicaid Other | Attending: Obstetrics and Gynecology | Admitting: Obstetrics and Gynecology
